# Patient Record
Sex: Female | Born: 1940 | Race: Black or African American | Hispanic: No | Marital: Single | Smoking: Never smoker
Health system: Southern US, Community
[De-identification: ages and names within clinical notes are randomized; demographics above are authoritative.]

## PROBLEM LIST (undated history)

## (undated) DIAGNOSIS — I639 Cerebral infarction, unspecified: Secondary | ICD-10-CM

## (undated) DIAGNOSIS — E7800 Pure hypercholesterolemia, unspecified: Secondary | ICD-10-CM

## (undated) DIAGNOSIS — H409 Unspecified glaucoma: Secondary | ICD-10-CM

## (undated) DIAGNOSIS — R51 Headache: Secondary | ICD-10-CM

## (undated) DIAGNOSIS — I10 Essential (primary) hypertension: Secondary | ICD-10-CM

## (undated) DIAGNOSIS — K219 Gastro-esophageal reflux disease without esophagitis: Secondary | ICD-10-CM

---

## 2011-04-05 ENCOUNTER — Emergency Department (INDEPENDENT_AMBULATORY_CARE_PROVIDER_SITE_OTHER): Payer: PRIVATE HEALTH INSURANCE

## 2011-04-05 ENCOUNTER — Emergency Department (HOSPITAL_BASED_OUTPATIENT_CLINIC_OR_DEPARTMENT_OTHER)
Admission: EM | Admit: 2011-04-05 | Discharge: 2011-04-05 | Disposition: A | Discharge status: Skilled Nursing Facility | Payer: PRIVATE HEALTH INSURANCE | Attending: Emergency Medicine | Admitting: Emergency Medicine

## 2011-04-05 ENCOUNTER — Encounter: Payer: Self-pay | Admitting: Emergency Medicine

## 2011-04-05 DIAGNOSIS — M79609 Pain in unspecified limb: Secondary | ICD-10-CM

## 2011-04-05 DIAGNOSIS — W19XXXA Unspecified fall, initial encounter: Secondary | ICD-10-CM

## 2011-04-05 DIAGNOSIS — S7000XA Contusion of unspecified hip, initial encounter: Secondary | ICD-10-CM | POA: Insufficient documentation

## 2011-04-05 DIAGNOSIS — Y9210 Unspecified residential institution as the place of occurrence of the external cause: Secondary | ICD-10-CM | POA: Insufficient documentation

## 2011-04-05 DIAGNOSIS — E7800 Pure hypercholesterolemia, unspecified: Secondary | ICD-10-CM | POA: Insufficient documentation

## 2011-04-05 DIAGNOSIS — Z79899 Other long term (current) drug therapy: Secondary | ICD-10-CM | POA: Insufficient documentation

## 2011-04-05 DIAGNOSIS — M549 Dorsalgia, unspecified: Secondary | ICD-10-CM

## 2011-04-05 DIAGNOSIS — K219 Gastro-esophageal reflux disease without esophagitis: Secondary | ICD-10-CM | POA: Insufficient documentation

## 2011-04-05 DIAGNOSIS — I10 Essential (primary) hypertension: Secondary | ICD-10-CM | POA: Insufficient documentation

## 2011-04-05 DIAGNOSIS — W010XXA Fall on same level from slipping, tripping and stumbling without subsequent striking against object, initial encounter: Secondary | ICD-10-CM | POA: Insufficient documentation

## 2011-04-05 DIAGNOSIS — Z8679 Personal history of other diseases of the circulatory system: Secondary | ICD-10-CM | POA: Insufficient documentation

## 2011-04-05 DIAGNOSIS — Z043 Encounter for examination and observation following other accident: Secondary | ICD-10-CM

## 2011-04-05 DIAGNOSIS — E119 Type 2 diabetes mellitus without complications: Secondary | ICD-10-CM | POA: Insufficient documentation

## 2011-04-05 HISTORY — DX: Gastro-esophageal reflux disease without esophagitis: K21.9

## 2011-04-05 HISTORY — DX: Essential (primary) hypertension: I10

## 2011-04-05 HISTORY — DX: Cerebral infarction, unspecified: I63.9

## 2011-04-05 HISTORY — DX: Headache: R51

## 2011-04-05 HISTORY — DX: Pure hypercholesterolemia, unspecified: E78.00

## 2011-04-05 MED ORDER — MORPHINE SULFATE 4 MG/ML IJ SOLN
4.0000 mg | Freq: Once | INTRAMUSCULAR | Status: AC
  Administered 2011-04-05: 4 mg via INTRAVENOUS
  Filled 2011-04-05: qty 1

## 2011-04-05 MED ORDER — HYDROCODONE-ACETAMINOPHEN 5-500 MG PO TABS: 1.0000 | ORAL_TABLET | Freq: Four times a day (QID) | ORAL | Status: AC | PRN

## 2011-04-05 MED ORDER — SODIUM CHLORIDE 0.9 % IV SOLN
Freq: Once | INTRAVENOUS | Status: AC
  Administered 2011-04-05: 08:00:00 via INTRAVENOUS

## 2011-04-05 NOTE — ED Notes (Signed)
Per EMS:  Pt from local nursing home.  Pt fell in bathroom on urine.  Pt pulled herself to standing.  EMS states pt was waiting in hallway when they arrived.  Pt denies hitting her head.  C/o left thigh pain.  No dislocation noted, no rotation or shortening noted.

## 2011-04-05 NOTE — ED Provider Notes (Signed)
History     CSN: [id number] Arrival date & time: 04/05/2011  7:30 AM  Chief Complaint  Patient presents with  . Fall   HPI Comments: Patient arrives by EMS after a mechanical fall at her skilled nursing facility. Patient has a history of stroke and walks with a walker. She is in the bathroom and slipped on some urine resulting in a fall. She fell onto her left hip. She complains of pain to her left hip and thigh. She also has some thoracic back tenderness. She denies chest pain, shortness of breath, abdominal pain, neck pain. There is no loss of consciousness. There is no headache. She has no weakness, paresthesias, loss of bowel or bladder function. No nausea or vomiting.  Patient is a 70 y.o. female presenting with fall. The history is provided by the patient and the EMS personnel. No language interpreter was used.  Fall The accident occurred less than 1 hour ago. The fall occurred while standing. She fell from a height of 3 to 5 ft (from standing position). She landed on a hard floor. The point of impact was the left hip. The pain is present in the left hip. The pain is moderate. She was not ambulatory at the scene (she did pull herself to standing position after fall). There was no entrapment after the fall. There was no drug use involved in the accident. There was no alcohol use involved in the accident. Pertinent negatives include no visual change, no fever, no numbness, no abdominal pain, no bowel incontinence, no nausea, no vomiting, no headaches, no loss of consciousness and no tingling. The symptoms are aggravated by ambulation. She has tried nothing for the symptoms.    Past Medical History  Diagnosis Date  . Diabetes mellitus   . Hypertension   . Stroke   . GERD (gastroesophageal reflux disease)   . Dysphagia   . Headache   . Hypercholesteremia     History reviewed. No pertinent past surgical history.  History reviewed. No pertinent family history.  History  Substance Use  Topics  . Smoking status: Never Smoker   . Smokeless tobacco: Not on file  . Alcohol Use: No    OB History    Grav Para Term Preterm Abortions TAB SAB Ect Mult Living                  Review of Systems  Constitutional: Negative for fever and fatigue.  HENT: Negative for neck pain and neck stiffness.   Eyes: Positive for redness. Negative for photophobia and pain.  Respiratory: Negative for cough, chest tightness and shortness of breath.   Cardiovascular: Negative for chest pain and palpitations.  Gastrointestinal: Negative for nausea, vomiting, abdominal pain and bowel incontinence.  Genitourinary: Negative for dysuria, urgency and frequency.  Musculoskeletal: Positive for back pain and gait problem (chronic abnormal gait associated with stroke). Negative for joint swelling.  Skin: Negative for rash and wound.  Neurological: Negative for dizziness, tingling, loss of consciousness, weakness, numbness and headaches.  All other systems reviewed and are negative.    Physical Exam  BP 166/80  Pulse 61  Temp(Src) 97.2 F (36.2 C) (Oral)  Resp 20  SpO2 100%  Physical Exam  Nursing note and vitals reviewed. Constitutional: She is oriented to person, place, and time. She appears well-developed and well-nourished. No distress.  HENT:  Head: Normocephalic and atraumatic.  Mouth/Throat: Oropharynx is clear and moist.  Eyes: EOM are normal. Right conjunctiva is injected. Right pupil is not  round and not reactive. Left pupil is reactive. Pupils are unequal (R pupil affected by surgery).  Neck: Normal range of motion. Neck supple.  Cardiovascular: Normal rate, regular rhythm, normal heart sounds and intact distal pulses.   Pulmonary/Chest: Effort normal and breath sounds normal. No respiratory distress.  Abdominal: Soft. Bowel sounds are normal. There is no tenderness.  Musculoskeletal: She exhibits tenderness.       Left hip: She exhibits decreased range of motion (due to pain) and  tenderness. She exhibits no swelling, no crepitus and no deformity.       Thoracic back: She exhibits tenderness and bony tenderness. She exhibits no swelling and no deformity.       Left upper leg: She exhibits tenderness. She exhibits no swelling and no deformity.  Neurological: She is alert and oriented to person, place, and time.       Moving all four extremities spontaneously.  Weak on R side from prior stroke. Is able to move injured LLE spontaneously at hip joint.  Sensation intact distally.  Skin: Skin is warm and dry. No rash noted.       No identifiable wounds    ED Course  Procedures  MDM 1. Mechanical Fall  Patient's loss consistent with a mechanical fall. She did not have a loss of consciousness or strike her head nor does she have a headache. There is no indication for head CT at this time. Given that this was a purely mechanical fall there is no need for additional laboratory evaluation. Given the locations of her tenderness in the left hip, left thigh, thoracic spine I will obtain plain films of all these areas as I had low suspicion for fracture. No poison ivy and treat her pain with IV morphine. IV place in case additional testing and interventions are required.  Imaging reviewed by radiology and myself. There is no evidence of fracture to the thoracic spine, left hip, left femur. Patient's pain is improved. I advised the patient to request assistance when needing to be mobile until she is pain-free and comfortable. She'll be discharged back to her skilled nursing facility.  Dayton Bailiff, MD 04/05/11 [phone]

## 2012-01-20 ENCOUNTER — Emergency Department (HOSPITAL_BASED_OUTPATIENT_CLINIC_OR_DEPARTMENT_OTHER): Payer: PRIVATE HEALTH INSURANCE

## 2012-01-20 ENCOUNTER — Encounter (HOSPITAL_BASED_OUTPATIENT_CLINIC_OR_DEPARTMENT_OTHER): Payer: Self-pay | Admitting: Emergency Medicine

## 2012-01-20 ENCOUNTER — Emergency Department (HOSPITAL_BASED_OUTPATIENT_CLINIC_OR_DEPARTMENT_OTHER)
Admission: EM | Admit: 2012-01-20 | Discharge: 2012-01-20 | Disposition: A | Discharge status: Home / Self Care | Payer: PRIVATE HEALTH INSURANCE | Attending: Emergency Medicine | Admitting: Emergency Medicine

## 2012-01-20 DIAGNOSIS — I10 Essential (primary) hypertension: Secondary | ICD-10-CM | POA: Insufficient documentation

## 2012-01-20 DIAGNOSIS — Z79899 Other long term (current) drug therapy: Secondary | ICD-10-CM | POA: Insufficient documentation

## 2012-01-20 DIAGNOSIS — K219 Gastro-esophageal reflux disease without esophagitis: Secondary | ICD-10-CM | POA: Insufficient documentation

## 2012-01-20 DIAGNOSIS — M79609 Pain in unspecified limb: Secondary | ICD-10-CM | POA: Insufficient documentation

## 2012-01-20 DIAGNOSIS — E7800 Pure hypercholesterolemia, unspecified: Secondary | ICD-10-CM | POA: Insufficient documentation

## 2012-01-20 DIAGNOSIS — E119 Type 2 diabetes mellitus without complications: Secondary | ICD-10-CM | POA: Insufficient documentation

## 2012-01-20 DIAGNOSIS — M19019 Primary osteoarthritis, unspecified shoulder: Secondary | ICD-10-CM | POA: Insufficient documentation

## 2012-01-20 DIAGNOSIS — M1990 Unspecified osteoarthritis, unspecified site: Secondary | ICD-10-CM

## 2012-01-20 HISTORY — DX: Unspecified glaucoma: H40.9

## 2012-01-20 MED ORDER — HYDROCODONE-ACETAMINOPHEN 5-500 MG PO TABS: 1.0000 | ORAL_TABLET | Freq: Four times a day (QID) | ORAL | Status: AC | PRN

## 2012-01-20 MED ORDER — HYDROCODONE-ACETAMINOPHEN 5-325 MG PO TABS
1.0000 | ORAL_TABLET | Freq: Once | ORAL | Status: AC
  Administered 2012-01-20: 1 via ORAL
  Filled 2012-01-20: qty 1

## 2012-01-20 NOTE — ED Provider Notes (Addendum)
History   This chart was scribed for Tonya Sprout, MD scribed by Magnus Sinning. The patient was seen in room MH07/MH07 seen at 17:19    CSN: [id number]  Arrival date & time 01/20/12  1702   First MD Initiated Contact with Patient 01/20/12 1706      Chief Complaint  Patient presents with  . Shoulder Pain    (Consider location/radiation/quality/duration/timing/severity/associated sxs/prior treatment) HPI Tonya Matthews is a 71 y.o. female BIB EMS who presents to the Emergency Department complaining of constant moderate left shoulder pain that radiates to her left arm resulting from a fall, which occurred 10 days ago at [facility]. Patient denies prior hx of injury to shoulder, or of neck problems. Past Medical History  Diagnosis Date  . Diabetes mellitus   . Hypertension   . Stroke   . GERD (gastroesophageal reflux disease)   . Dysphagia   . Headache   . Hypercholesteremia   . Glaucoma     History reviewed. No pertinent past surgical history.  No family history on file.  History  Substance Use Topics  . Smoking status: Never Smoker   . Smokeless tobacco: Not on file  . Alcohol Use: No    Review of Systems  All other systems reviewed and are negative.   10 Systems reviewed and are negative for acute change except as noted in the HPI. Allergies  Penicillins  Home Medications   Current Outpatient Rx  Name Route Sig Dispense Refill  . ACETAMINOPHEN 500 MG PO TABS Oral Take 1,000 mg by mouth every 4 (four) hours as needed. For pain    . ASPIRIN 81 MG PO TABS Oral Take 81 mg by mouth daily.      . ATROPINE SULFATE 1 % OP SOLN Right Eye Place 1 drop into the right eye 2 (two) times daily.    Marland Kitchen BRIMONIDINE TARTRATE-TIMOLOL 0.2-0.5 % OP SOLN Right Eye Place 1 drop into the right eye 2 (two) times daily.    Marland Kitchen BRINZOLAMIDE 1 % OP SUSP Right Eye Place 1 drop into the right eye 2 (two) times daily.    Marland Kitchen DOCUSATE SODIUM 50 MG PO CAPS Oral Take by mouth daily.    Marland Kitchen ESCITALOPRAM  OXALATE 10 MG PO TABS Oral Take 10 mg by mouth daily.      Marland Kitchen GLIMEPIRIDE 2 MG PO TABS Oral Take 2 mg by mouth daily with breakfast.     . HYDRALAZINE HCL 25 MG PO TABS Oral Take 25 mg by mouth 2 (two) times daily.      Marland Kitchen METOPROLOL SUCCINATE ER 50 MG PO TB24 Oral Take 50 mg by mouth daily.      Marland Kitchen NIFEDIPINE ER OSMOTIC 30 MG PO TB24 Oral Take 30 mg by mouth 2 (two) times daily.     Marland Kitchen OMEPRAZOLE 20 MG PO CPDR Oral Take 20 mg by mouth daily.      Marland Kitchen PRAVASTATIN SODIUM 40 MG PO TABS Oral Take 40 mg by mouth at bedtime.        BP 164/82  Pulse 59  Temp(Src) 98.5 F (36.9 C) (Oral)  Resp 16  SpO2 96%  Physical Exam  Nursing note and vitals reviewed. Constitutional: She is oriented to person, place, and time. She appears well-developed and well-nourished. No distress.  HENT:  Head: Normocephalic and atraumatic.  Eyes: EOM are normal. Pupils are equal, round, and reactive to light.  Neck: Neck supple. No tracheal deviation present.  Cardiovascular: Normal rate.   Pulmonary/Chest: Effort  normal. No respiratory distress.  Abdominal: Soft. She exhibits no distension.  Musculoskeletal: Normal range of motion. She exhibits no edema.       Normal pulse and sensation. Normal strength of arm. Mild posterior shoulder pain. No C-spine tenderness.  Neurological: She is alert and oriented to person, place, and time. No sensory deficit.  Skin: Skin is warm and dry.  Psychiatric: She has a normal mood and affect. Her behavior is normal.    ED Course  Procedures (including critical care time) DIAGNOSTIC STUDIES: Oxygen Saturation is 96% on room air, normal by my interpretation.    COORDINATION OF CARE:  Labs Reviewed - No data to display Dg Shoulder Left  01/20/2012  *RADIOLOGY REPORT*  Clinical Data: Fall 10 days ago.  Pain.  LEFT SHOULDER - 2+ VIEW  Comparison: None.  Findings: There is no acute bony or joint abnormality. Acromioclavicular degenerative disease is seen.  Imaged left lung ribs are  clear.  IMPRESSION: No acute finding.  Acromioclavicular osteoarthritis.  Original Report Authenticated By: Bernadene Bell. D'ALESSIO, M.D.     1. Arthritis       MDM   Patient with mechanical fall 10 days ago with persistent left shoulder pain. Plain films are negative but it does show arthritis. After one hydrocodone patient states her pain is completely gone and she feels much better.  NV intact I personally performed the services described in this documentation, which was scribed in my presence.  The recorded information has been reviewed and considered.        Tonya Sprout, MD 01/20/12 1851  Tonya Sprout, MD 01/20/12 1610

## 2012-01-20 NOTE — Discharge Instructions (Signed)

## 2012-01-20 NOTE — ED Notes (Signed)
Per EMS:  Pt fell 10 days ago at facility.  Pt having left shoulder pain.  No obvious injuries.  No deficits.  Good CMS.  VSS.

## 2013-01-21 IMAGING — CR DG ANKLE COMPLETE 3+V*R*
3 series · 3 of 3 positions shown · non-contrast
Comparison: None.

CLINICAL DATA: Fall, right ankle pain/injury

RIGHT ANKLE - COMPLETE 3+ VIEW

[t ankle joint ap right]
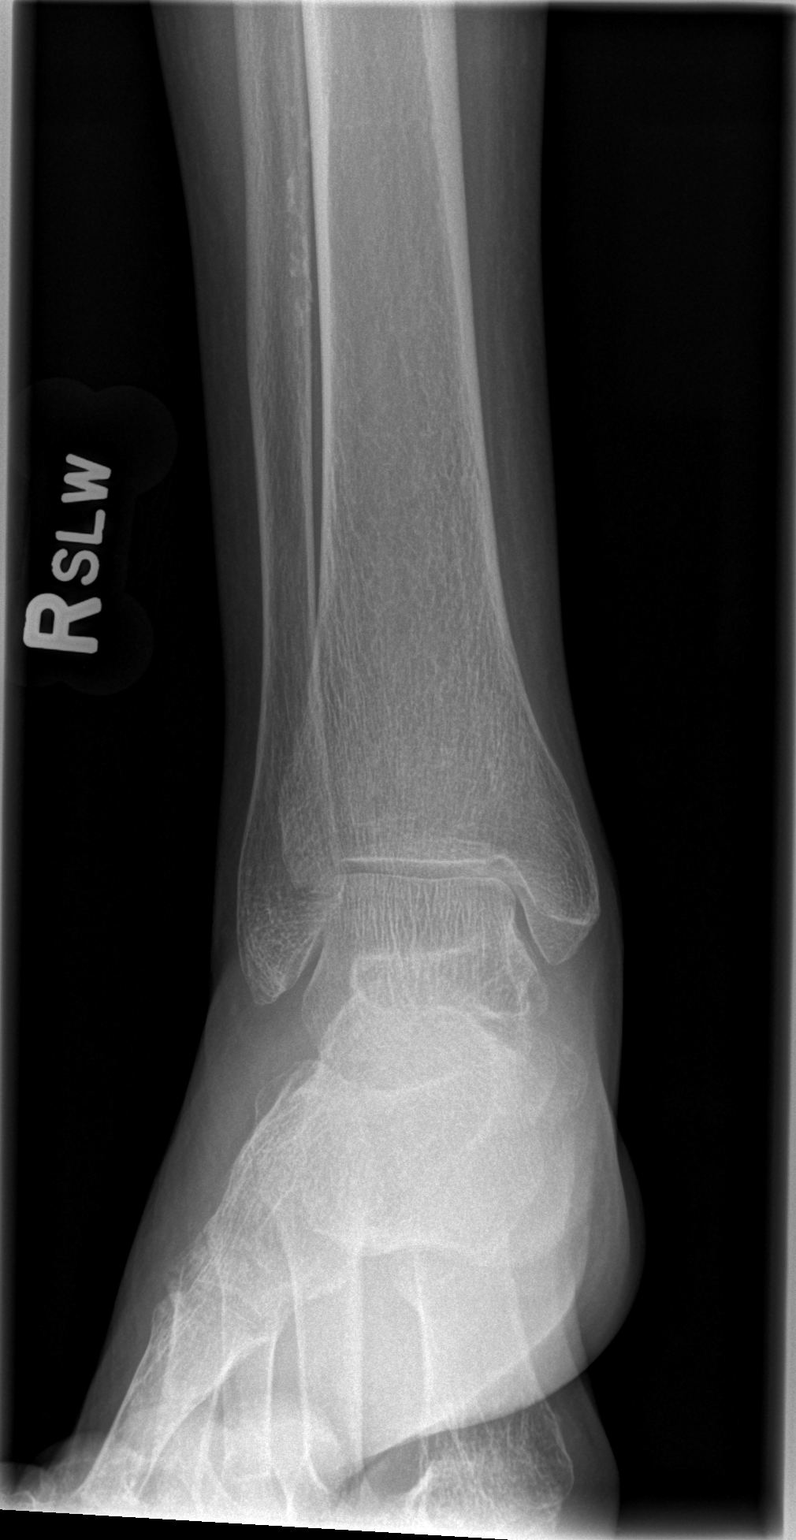

[t ankle joint oblique right]
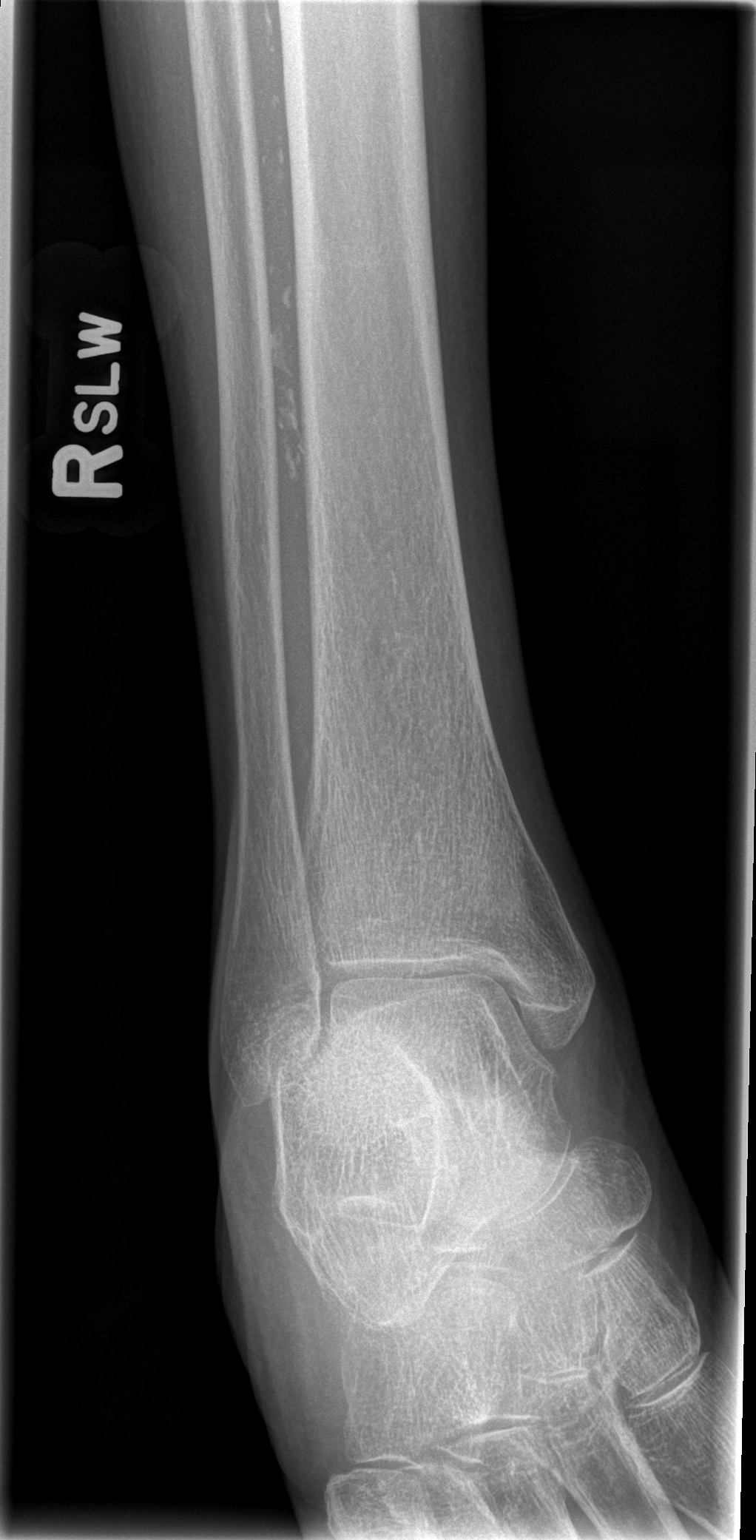

[t ankle joint lat right]
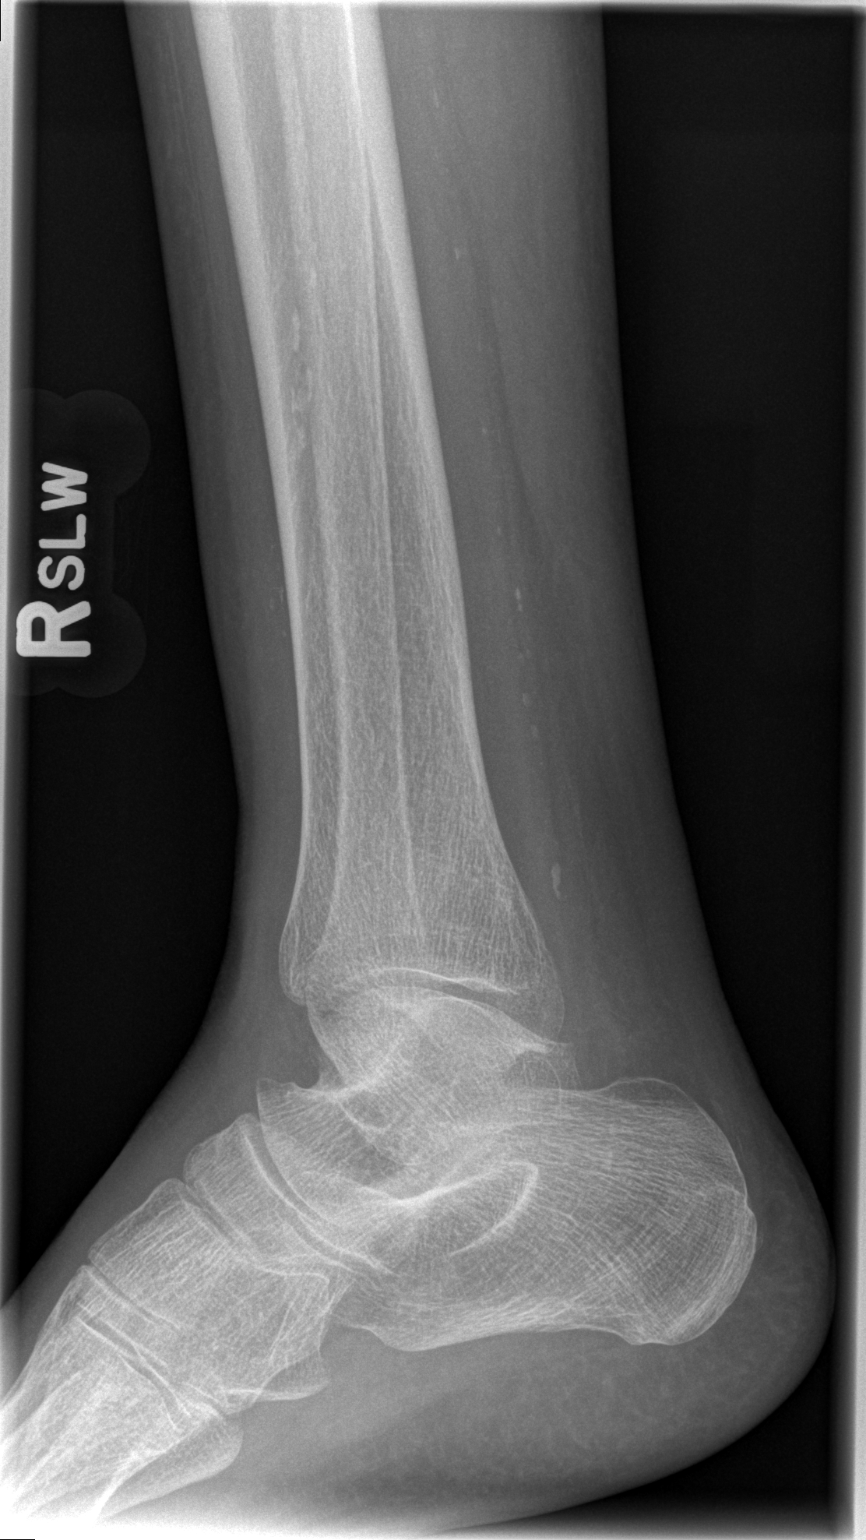

[3 of 3 positions shown; findings below may reference images not displayed]

FINDINGS: No fracture or dislocation is seen.

The ankle mortise is intact.

Mild anterior talar beaking.

The visualized soft tissues are unremarkable.
IMPRESSION: No fracture or dislocation is seen.

## 2023-02-28 ENCOUNTER — Other Ambulatory Visit (HOSPITAL_COMMUNITY): Payer: Self-pay | Admitting: *Deleted

## 2023-02-28 DIAGNOSIS — R1310 Dysphagia, unspecified: Secondary | ICD-10-CM

## 2023-03-12 ENCOUNTER — Ambulatory Visit (HOSPITAL_COMMUNITY): Admission: RE | Admit: 2023-03-12 | Payer: Medicare (Managed Care) | Source: Ambulatory Visit

## 2023-03-12 ENCOUNTER — Encounter (HOSPITAL_COMMUNITY): Payer: Self-pay

## 2023-03-12 ENCOUNTER — Ambulatory Visit (HOSPITAL_COMMUNITY): Admission: RE | Admit: 2023-03-12 | Payer: Self-pay | Source: Ambulatory Visit

## 2023-03-15 ENCOUNTER — Telehealth (HOSPITAL_COMMUNITY): Payer: Self-pay | Admitting: *Deleted

## 2023-03-15 NOTE — Telephone Encounter (Signed)
Attempted to contact [facility] to reschedule patient for missed OP MBS. Left VM. RKEEL

## 2023-03-26 ENCOUNTER — Ambulatory Visit (HOSPITAL_COMMUNITY): Admission: RE | Admit: 2023-03-26 | Payer: Medicare (Managed Care) | Source: Ambulatory Visit

## 2023-03-26 ENCOUNTER — Encounter (HOSPITAL_COMMUNITY): Payer: Self-pay

## 2023-06-21 ENCOUNTER — Other Ambulatory Visit (HOSPITAL_COMMUNITY): Payer: Self-pay | Admitting: *Deleted

## 2023-06-21 DIAGNOSIS — R1310 Dysphagia, unspecified: Secondary | ICD-10-CM

## 2023-07-04 ENCOUNTER — Ambulatory Visit (HOSPITAL_COMMUNITY)
Admission: RE | Admit: 2023-07-04 | Discharge: 2023-07-04 | Disposition: A | Discharge status: Home / Self Care | Payer: Medicare (Managed Care) | Source: Ambulatory Visit | Attending: *Deleted | Admitting: *Deleted

## 2023-07-04 ENCOUNTER — Ambulatory Visit (HOSPITAL_COMMUNITY)
Admission: RE | Admit: 2023-07-04 | Discharge: 2023-07-04 | Disposition: A | Discharge status: Home / Self Care | Payer: Medicare (Managed Care) | Source: Ambulatory Visit | Attending: Internal Medicine | Admitting: Internal Medicine

## 2023-07-04 DIAGNOSIS — R1310 Dysphagia, unspecified: Secondary | ICD-10-CM | POA: Insufficient documentation

## 2023-07-04 NOTE — Progress Notes (Signed)
Modified Barium Swallow Study  Patient Details  Name: Tonya Matthews MRN: [id number] Date of Birth: 07/12/1941  Today's Date: 07/04/2023  Modified Barium Swallow completed.  Full report located under Chart Review in the Imaging Section.  History of Present Illness Patient followed by [facility]. PMHX of stroke in 2014 and 2017. Previously resides in skilled nursing facility. Son has removed her to live with him. He reports increased coughing following meals. Pt recently prescribed reflux medication. Medical records not avaliable.   Clinical Impression Ms. Manson Passey participated in [place] today. She presents with [facility] swallow function and safety. Pt demonstrated disorganized tongue movement and occasional lingual pumping, however transported entire bolus to pharynx with trace residue noted. Decreased laryngeal elevation observed when epiglottis is in the most horizontal position. Otherwise, sufficient airway protection c/b complete epiglottic inversion and laryngeal vestibule closure. Pt noted to achieve full pharyngeal clearance with no reside observed across consistencies. One instance of trace and transient penetration above the vocal folds observed with thin liquids when attempting pill clearance. Pt required x2 attempts to achieve A-P transit of pill. Esophageal sweep unremarkable. SLP recommends continuing PO diet with regular and thin liquids. Recommend ongoing adherence to reportedly prescribed reflux medication with implementation of reflux diet modification (avoid spicy or acidic food, caffeine, peppermint). Provided education to pt and caregiver regarding evaluation results, diet recommendation, and reflux precautions. Son verbalized understanding at conclusion of evaluation. No further ST interventions for dysphagia are indicated. Factors that may increase risk of adverse event in presence of aspiration [employer] Clearance Coots 2021): Reduced cognitive function;Limited mobility;Frail or  deconditioned  Swallow Evaluation Recommendations Recommendations: PO diet PO Diet Recommendation: Regular;Thin liquids (Level 0) Liquid Administration via: Cup;Spoon Medication Administration: Whole meds with liquid Supervision: Patient able to self-feed Swallowing strategies  : Minimize environmental distractions;Slow rate;Small bites/sips Postural changes: Position pt fully upright for meals;Stay upright 30-60 min after meals Oral care recommendations: Oral care BID (2x/day)      Maia Breslow 07/04/2023,2:01 PM

## 2023-11-05 ENCOUNTER — Ambulatory Visit
Admission: RE | Admit: 2023-11-05 | Discharge: 2023-11-05 | Disposition: A | Discharge status: Home / Self Care | Payer: Medicare (Managed Care) | Source: Ambulatory Visit | Attending: Internal Medicine | Admitting: Internal Medicine

## 2023-11-05 ENCOUNTER — Other Ambulatory Visit: Payer: Self-pay | Admitting: Internal Medicine

## 2023-11-05 DIAGNOSIS — M25571 Pain in right ankle and joints of right foot: Secondary | ICD-10-CM

## 2023-12-31 ENCOUNTER — Ambulatory Visit (HOSPITAL_COMMUNITY)
Admission: RE | Admit: 2023-12-31 | Discharge: 2023-12-31 | Disposition: A | Discharge status: Nursing Home (Includes ICF) | Payer: Medicare (Managed Care) | Attending: Nephrology | Admitting: Nephrology

## 2023-12-31 ENCOUNTER — Other Ambulatory Visit: Payer: Self-pay

## 2023-12-31 ENCOUNTER — Encounter (HOSPITAL_COMMUNITY)
Admission: RE | Disposition: A | Discharge status: Nursing Home (Includes ICF) | Payer: Self-pay | Source: Home / Self Care | Attending: Nephrology

## 2023-12-31 ENCOUNTER — Encounter (HOSPITAL_COMMUNITY): Payer: Self-pay | Admitting: Nephrology

## 2023-12-31 DIAGNOSIS — F0150 Vascular dementia without behavioral disturbance: Secondary | ICD-10-CM | POA: Diagnosis not present

## 2023-12-31 DIAGNOSIS — Z992 Dependence on renal dialysis: Secondary | ICD-10-CM | POA: Diagnosis not present

## 2023-12-31 DIAGNOSIS — E1122 Type 2 diabetes mellitus with diabetic chronic kidney disease: Secondary | ICD-10-CM | POA: Diagnosis not present

## 2023-12-31 DIAGNOSIS — I120 Hypertensive chronic kidney disease with stage 5 chronic kidney disease or end stage renal disease: Secondary | ICD-10-CM | POA: Insufficient documentation

## 2023-12-31 DIAGNOSIS — Z8673 Personal history of transient ischemic attack (TIA), and cerebral infarction without residual deficits: Secondary | ICD-10-CM | POA: Insufficient documentation

## 2023-12-31 DIAGNOSIS — Z79899 Other long term (current) drug therapy: Secondary | ICD-10-CM | POA: Insufficient documentation

## 2023-12-31 DIAGNOSIS — T82858A Stenosis of vascular prosthetic devices, implants and grafts, initial encounter: Secondary | ICD-10-CM | POA: Insufficient documentation

## 2023-12-31 DIAGNOSIS — Y832 Surgical operation with anastomosis, bypass or graft as the cause of abnormal reaction of the patient, or of later complication, without mention of misadventure at the time of the procedure: Secondary | ICD-10-CM | POA: Diagnosis not present

## 2023-12-31 DIAGNOSIS — D631 Anemia in chronic kidney disease: Secondary | ICD-10-CM | POA: Diagnosis not present

## 2023-12-31 DIAGNOSIS — N186 End stage renal disease: Secondary | ICD-10-CM | POA: Insufficient documentation

## 2023-12-31 HISTORY — PX: A/V SHUNT INTERVENTION: CATH118220

## 2023-12-31 SURGERY — A/V SHUNT INTERVENTION
Anesthesia: LOCAL

## 2023-12-31 MED ORDER — FENTANYL CITRATE (PF) 100 MCG/2ML IJ SOLN
INTRAMUSCULAR | Status: AC
  Filled 2023-12-31: qty 2

## 2023-12-31 MED ORDER — SODIUM CHLORIDE 0.9 % IV SOLN: INTRAVENOUS | Status: DC

## 2023-12-31 MED ORDER — IODIXANOL 320 MG/ML IV SOLN
INTRAVENOUS | Status: DC | PRN
  Administered 2023-12-31: 8 mL

## 2023-12-31 MED ORDER — LIDOCAINE HCL (PF) 1 % IJ SOLN
INTRAMUSCULAR | Status: DC | PRN
  Administered 2023-12-31: 5 mL via INTRADERMAL

## 2023-12-31 MED ORDER — HEPARIN (PORCINE) IN NACL 2000-0.9 UNIT/L-% IV SOLN
INTRAVENOUS | Status: DC | PRN
  Administered 2023-12-31 (×2): 1000 mL

## 2023-12-31 MED ORDER — FENTANYL CITRATE (PF) 100 MCG/2ML IJ SOLN
INTRAMUSCULAR | Status: DC | PRN
  Administered 2023-12-31: 25 ug via INTRAVENOUS

## 2023-12-31 MED ORDER — LIDOCAINE HCL (PF) 1 % IJ SOLN
INTRAMUSCULAR | Status: AC
  Filled 2023-12-31: qty 30

## 2023-12-31 SURGICAL SUPPLY — 9 items
BAG SNAP BAND KOVER 36X36 (MISCELLANEOUS) ×1 IMPLANT
BALLOON MUSTANG 8.0X40 75 (BALLOONS) IMPLANT
COVER DOME SNAP 22 D (MISCELLANEOUS) ×1 IMPLANT
GUIDEWIRE ANGLED .035 180CM (WIRE) IMPLANT
KIT MICROPUNCTURE NIT STIFF (SHEATH) IMPLANT
SHEATH PINNACLE 6F 10CM (SHEATH) IMPLANT
SHEATH PINNACLE R/O II 6F 4CM (SHEATH) IMPLANT
SYR MEDALLION 10ML (SYRINGE) IMPLANT
TRAY PV CATH (CUSTOM PROCEDURE TRAY) ×1 IMPLANT

## 2023-12-31 NOTE — H&P (Signed)
 Chief Complaint: Decreased access flow, prolonged cannulation site bleeding HPI:  83 year old woman with past medical history significant for hypertension, diabetes mellitus, prior history of CVAs, vascular dementia, history of meningioma and end-stage renal disease on hemodialysis.  She is referred for concerns of decreasing access flows and prolonged cannulation site bleeding of her left brachiobasilic fistula.  Previous access history is unavailable on EPIC.  She denies any constitutional complaints including fever or chills and reports intermittent shoulder pain.  She denies any shortness of breath or chest pain at this time.  The procedure was described to the patient/HCPOA and they consent to proceed after weighing risks and benefits.  Past Medical History:  Diagnosis Date   Diabetes mellitus    Dysphagia    GERD (gastroesophageal reflux disease)    Glaucoma    Headache(784.0)    Hypercholesteremia    Hypertension    Stroke (HCC)     No past surgical history on file.  No family history on file. Social History:  reports that she has never smoked. She does not have any smokeless tobacco history on file. She reports that she does not drink alcohol and does not use drugs.  Allergies:  Allergies  Allergen Reactions   Lisinopril Other (See Comments)    Unknown - allergy not listed on MAR from [facility].   Tetracycline Dermatitis and Itching   Donepezil    Penicillins Other (See Comments)    unknown    Medications Prior to Admission  Medication Sig Dispense Refill   acetaminophen  (TYLENOL ) 325 MG tablet Take 650 mg by mouth every 12 (twelve) hours as needed (Pain). For pain     AURYXIA 1 GM 210 MG(Fe) tablet Take 420 mg by mouth 3 (three) times daily with meals.     cholecalciferol (VITAMIN D3) 25 MCG (1000 UNIT) tablet Take 2,000 Units by mouth daily. Gummies     Evolocumab 140 MG/ML SOAJ Inject 140 mg into the skin every 14 (fourteen) days.     lansoprazole  (PREVACID) 30 MG capsule Take 30 mg by mouth daily at 12 noon.     Menthol, Topical Analgesic, (BIOFREEZE COOL THE PAIN) 4 % GEL Apply 1 application  topically every 12 (twelve) hours as needed (Apply to both hips/).     Menthol, Topical Analgesic, (BIOFREEZE COOL THE PAIN) 4 % GEL Apply 1 application  topically 3 (three) times daily as needed (Allpy to rt foot and ankle).     sennosides (SENOKOT) 8.8 MG/5ML syrup Take 5 mLs by mouth at bedtime. Hold for loose stool     Tenapanor HCl, CKD, (XPHOZAH) 30 MG TABS Take 30 mg by mouth 2 (two) times daily before a meal.     White Petrolatum-Mineral Oil (REFRESH P.M. OP) Place 1 drop into both eyes daily as needed (Dry eyes).      No results found for this or any previous visit (from the past 48 hours). No results found.  Review of Systems  All other systems reviewed and are negative.   There were no vitals taken for this visit. Physical Exam Vitals reviewed.  Constitutional:      Appearance: Normal appearance. She is normal weight.  HENT:     Head: Normocephalic and atraumatic.     Mouth/Throat:     Mouth: Mucous membranes are dry.     Pharynx: Oropharynx is clear.  Eyes:     Extraocular Movements: Extraocular movements intact.  Cardiovascular:     Rate and Rhythm: Normal rate and  regular rhythm.     Pulses: Normal pulses.     Heart sounds: Normal heart sounds.  Pulmonary:     Breath sounds: Normal breath sounds.  Musculoskeletal:     Cervical back: Neck supple.     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  Pulsatile over aneurysms with mildly pulsatile outflow basilic vein.  High-pitched bruit over axillary region.  Neurological:     Mental Status: She is alert.      Assessment/Plan 1.  Decreased access flow/prolonged cannulation site bleeding: Will undertake fistulogram to evaluate for etiology and offer management with angioplasty as indicated.  Procedure described to the patient/HCPOA and the  consent to proceed after weighing risks and benefits. 2.  End-stage renal disease: Resume hemodialysis on outpatient MWF schedule following procedure completion today. 3.  Hypertension: Blood pressure will be monitored with moderate sedation during her procedure today. 4.  Anemia: Denies any overt blood loss, will resume H/H monitoring and ESA per protocol at outpatient dialysis.  Melodie Spry, MD 12/31/2023, 10:48 AM

## 2023-12-31 NOTE — Op Note (Signed)
 Patient presents for concerns of prolonged cannulation site leading and decreased access flows of her left BBT (unknown when placed/last procedure). On the physical exam, the fistula has 2 medium sized aneurysms in the active cannulation zone and is hyperpulsatile with a harsh bruit in the outflow limb.  Sluggish collapse on arm elevation with normal augmentation.  Summary:  1)      The patient had successful angioplasty (8 mm Mustang FE ~18 atm) of significant stenosis in the outflow basilic vein swing site-this was just prior to the flared stent at the swing site. 2)      Patent body of the fistula, left axillary and central veins.  Patent inflow basilic vein and arterial anastomosis on reflux arteriogram. 3)      This left BBT remains amenable to future percutaneous interventions.  Description of procedure: The arm was prepped and draped in the usual sterile fashion. The left upper arm brachial basilic fistula was cannulated ([id number]) with a a 21G micropuncture needle directed in an antegrade direction in arterial limb of the fistula. A guidewire was inserted and exchanged for a 6 Fr sheath. Contrast [phone]) injection via the side port of the sheath was performed. The angiogram of the fistula ([id number]) showed a 1-2 cm long 70% stenosis in the outflow basilic swing site adjacent to the location of a previous swing site stent (appears to be a flared stent) with some inflow edge of stent stenosis.  The left axillary vein, centrals, medium sized aneurysms in the cannulation zone, inflow basilic vein and arterial anastomosis were patent. Flows were sluggish through the fistula.   A 0.035 hydrophilic wire was then inserted through the sheath and parked in the central veins. An 8 x 40 mm Mustang angioplasty balloon was inserted over the guidewire and positioned at the basilic vein outflow swing site stenosis/inflow edge of stent.   Venous angioplasty ([id number]) was carried out to 18 ATM with FULL effacement of the  waist on the balloon at the basilic outflow swing site lesion. The repeat angiogram showed 10% residual stenosis at the outflow basilic swing site without any evidence of extravasation or dissection.  Flows were now more rapid through the fistula circuit.  Hemostasis: A 3-0 ethilon purse string suture was placed at the cannulation site on removal of the sheath.  Sedation: 25 mcg Fentanyl. Sedation time. N/A  Contrast. 8 mL  Monitoring: Because of the patient's comorbid conditions and sedation during the procedure, continuous EKG monitoring and O2 saturation monitoring was performed throughout the procedure by the RN. There were no abnormal arrhythmias encountered.  Complications: None.   Diagnoses: I87.1 Stricture of vein  N18.6 ESRD T82.858A Stricture of access  Procedure Coding:  [phone] Cannulation and angiogram of fistula, venous angioplasty (basilic vein outflow swing site) [id number] Contrast  Recommendations:  1. Continue to cannulate the fistula with 15G needles.  2. Refer for problems with flows/swelling. 3. Remove the suture next treatment.   Discharge: The patient was discharged home in stable condition. The patient was given education regarding the care of the dialysis access AVF and specific instructions in case of any problems.

## 2024-07-28 ENCOUNTER — Other Ambulatory Visit (HOSPITAL_COMMUNITY): Payer: Self-pay | Admitting: *Deleted

## 2024-07-28 DIAGNOSIS — R1310 Dysphagia, unspecified: Secondary | ICD-10-CM

## 2024-08-06 NOTE — Progress Notes (Signed)
 Tonya Matthews [id number] 1941/07/31 (83 y.o.)  Initial Visit: No referring provider defined for this encounter.   Chief Complaint:  Chief Complaint  Patient presents with   Left Wrist - Follow-up    Left wrist fx      HPI: Tonya Matthews is a 83 y.o. 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  Recall she injured this about 5.5 weeks ago and underwent closed reduction.  She has been treated in closed fashion.  At last visit he was placed in a short arm cast left upper extremity recommendations nonweightbearing.  She was prescribed weekly vitamin D and Norco for pain.  Today she is accompanied by transportation aide.  She states her left wrist pain is improving.  She reports compliance with cast and restrictions.  She denies any new injuries.  States is not getting anything for pain at her facilities.  Fortunately her pain is now mild.  She denies numbness or tingling.   If completed Orthopedic intake form with comprehensive medical, surgical, social history as well as review of systems, drug allergies and current medications were reviewed by myself and scanned into the patient's chart as applicable. Updates to EMR history performed as indicated.    Medical History[1]  Surgical History[2]  Social Connections: Unknown (12/27/2021)   Received from [facility]   Social Network    Social Network: Not on file    Allergies[3]  Current Outpatient Medications  Medication Instructions   acetaminophen  160 mg TbDL 2 tablets, oral, 2 times daily, For hip pain   aspirin 81 mg, oral, Daily   BIOFREEZE, MENTHOL, TOP Apply to both hips twice daily as needed   cholecalciferol (vitamin D3) 2,000 Units, oral, Daily   ergocalciferol (VITAMIN D2) 50,000 Units, oral, Weekly   evolocumab 140 mg, subcutaneous, Every 14 days, Gets at Dialysis     ferric citrate (AURYXIA) 420 mg, oral, 2 times daily, With meals   HYDROcodone -acetaminophen  (NORCO) 5-325 mg per tablet 1 tablet, oral, Every 6 hours PRN   lansoprazole (PREVACID SOLUTAB) 30 mg, oral, Daily   sennosides (SENOKOT) 8.8 mg/5 mL syrp syrup 5 mL, oral, Nightly   sevelamer carbonate (RENVELA) 0.8 g, oral, 3 times daily with meals   tenapanor (Xphozah) 30 mg tab 1 tablet, oral, 2 times daily   white petrolatum-mineral oiL (Refresh P.M.) 57.3-42.5 % oint 0.25 inches, Both Eyes, At  bedtime PRN     Constitutional symptoms: negative Respiratory:  negative Skin:  negative Neurological:  negative Musculoskeletal:  joint pain or swelling   If completed Review of Systems from today's patient questionnaire encounter form filled out by patient was obtained. A copy of the patient's questionnaire form was scanned into the chart today.   PHYSICAL EXAM: LMP  (LMP Unknown)   -General:  No acute distress. Appears to be stated age  -HEENT:  Normocephalic, atraumatic. -Respiratory:  Normal respiratory effort.   -Psych:  Alert and Oriented.    ORTHOPEDIC EXAM:   On examination of the left wrist she has mild swelling.  No open wounds.  Mild tenderness to palpation over the distal radius dorsally.  She has stiffness with about 5 degree arc to flexion extension actively.  She is able make composite fist and extend the fingers well.  Mildly reduced grip strength.  Neuro vas intact.  Special Investigations- Review of Diagnostic Test:   Review of external  notes: I have reviewed external notes as follows- not applicable  Lab Studies: I have personally reviewed lab studies including-   07/06/2024 BMP eGFR 5.  Outside Imaging/Studies: I have personally reviewed outside imaging and studies as follows- not applicable  Radiologic Studies: I have personally reviewed plain x-ray images from [facility].   Radiology Results (last 72 hours)     Procedure Component Value Units Date/Time    XR Wrist 2 Views Left [[id number]] Resulted: 08/06/24 1111   Order Status: Completed Updated: 08/06/24 1111   Narrative:     Radiographs of the left wrist including 4 views reviewed demonstrating distal radius metaphyseal fracture with intra-articular extension status post closed reduction doing in stable alignment with mild impaction and neutral tilt.  Interval callus formation is noted.  Significant arthrosclerotic disease noted.  Diffuse osteopenia.        Reviewed Images taken in office today ordered and interpreted by myself. See interpretation above.   Review of studies with other medical provider:  not applicable   ASSESSMENT:  1. Closed Colles' fracture of left radius with routine healing, subsequent encounter  XR Wrist 2 Views Left   Amb DME wrist brace    2. 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 failure to thrive stroke syndrome gastric ulcer hypertension hemiplegia who is right-hand dominant seen today for left wrist distal radius fracture  Duration: 5.5 wk - injury  Conservative Treatment tried: Closed treatment Response: good Pertinent exam/history findings: n/a Reviewed: Imaging Lab(s) Provider documentation per above Contraindications: NSAIDs given chronic kidney disease/decreased kidney function as reviewed per labs above    Orders/Rx(s):  Imaging: Left wrist XR  Prescriptions/Med management:  Transition from weekly vitamin D completed to daily vitamin D 2000 units DME:  Removable wrist brace Referrals:   N/A Labs:   N/A Procedures:   N/A   PLAN:  Reviewed with pt:  imaging findings diagnosis and prognosis treatment options including conservative versus operative treatment options risk and benefits  Recommend: After discussion of treatment options with the patient recommend continue non operative/conservative/closed treatment  Rest  elevation compression ice  PRN application for 10-15 min multiple times daily with skin barrier and minimum 15 min interval in between ice/heat app. analgesics- PRN tylenol   Restrictions: Removable wrist brace left upper extremity protected weightbearing no lifting greater than 2 pounds-around-the-clock bracing x 1 week then begin coming out for range of motion. Avoid provacative activities  Rest -no sports/gym/weight training Note Provided:  N/A Provider directed home exercise program: Wrist fracture rehab to be initiated in 1 week complete once daily. Avoid aggravating exercises (reviewed today with pt) Follow Up: 3 weeks for repeat exam x-rays on arrival left wrist  We thoroughly discussed the patient's diagnosis, pertinent exam and imaging/lab findings, natural history of the diagnosis,and the plan moving forward. All questions were answered.   Red flag symptoms and return precautions discussed with the patient.   NOTE - This record was created using Scientist, clinical (histocompatibility and immunogenetics). Chart creation errors have been sought, but may not have been located. Such creation errors do not reflect on the standard of medical care.    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 No pertinent surgical history. [3] Allergies Allergen Reactions   Lisinopril Other (See Comments)    Unknown - allergy not listed on MAR from [facility].   Penicillins Itching, Rash, Swelling and Other (See Comments)    unknown   Tetracycline Itching, Rash and Dermatitis   Tetracyclines Itching and Rash   Donepezil

## 2024-09-10 ENCOUNTER — Ambulatory Visit (HOSPITAL_COMMUNITY)
Admission: RE | Admit: 2024-09-10 | Discharge: 2024-09-10 | Disposition: A | Discharge status: Home / Self Care | Payer: Medicare (Managed Care) | Source: Ambulatory Visit | Attending: Internal Medicine | Admitting: Internal Medicine

## 2024-09-10 DIAGNOSIS — R1311 Dysphagia, oral phase: Secondary | ICD-10-CM | POA: Insufficient documentation

## 2024-09-10 DIAGNOSIS — R1310 Dysphagia, unspecified: Secondary | ICD-10-CM

## 2024-09-10 DIAGNOSIS — I120 Hypertensive chronic kidney disease with stage 5 chronic kidney disease or end stage renal disease: Secondary | ICD-10-CM | POA: Diagnosis not present

## 2024-09-10 DIAGNOSIS — I69391 Dysphagia following cerebral infarction: Secondary | ICD-10-CM | POA: Diagnosis present

## 2024-09-10 DIAGNOSIS — Z992 Dependence on renal dialysis: Secondary | ICD-10-CM | POA: Insufficient documentation

## 2024-09-10 DIAGNOSIS — N186 End stage renal disease: Secondary | ICD-10-CM | POA: Diagnosis not present

## 2024-09-10 NOTE — Progress Notes (Signed)
 Modified Barium Swallow Study  Patient Details  Name: Tonya Matthews MRN: [id number] Date of Birth: [birth year]  Today's Date: 09/10/2024  Modified Barium Swallow completed.  Full report located under Chart Review in the Imaging Section.  History of Present Illness 84 yo female here 1/22 for OP MBS reporting coughing while eating/drinking. 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 The swallow is initiated as the bolus reaches the pyriform sinuses and larygneal closure is complete at the height of the swallow. No penetration/aspiration was observed. She clears her oropharynx of all but trace residuals. The 13 mm barium tablet was given with thin liquids and traveled promptly through her esophagus. Continue current diet without ongoing SLP f/u. Factors that may increase risk of adverse event in presence of aspiration Tonya Matthews & Tonya Matthews 2021): Limited mobility  Swallow Evaluation Recommendations Recommendations: PO diet PO Diet Recommendation: Regular;Thin liquids (Level 0) Liquid Administration via: Cup;Straw Medication Administration: Whole meds with liquid Supervision: Patient able to self-feed;Set-up assistance for safety Swallowing strategies  : Minimize environmental distractions;Slow rate;Small bites/sips Postural changes: Position pt fully upright for meals Oral care recommendations: Oral care BID (2x/day)   Damien Blumenthal, M.A., CCC-SLP Speech Language Pathology, Acute Rehabilitation Services  Secure Chat preferred [phone]  09/10/2024,2:10 PM
